# Patient Record
Sex: Male | Born: 1990 | Race: White | Hispanic: No | Marital: Single | Smoking: Never smoker
Health system: Southern US, Community
[De-identification: ages and names within clinical notes are randomized; demographics above are authoritative.]

## PROBLEM LIST (undated history)

## (undated) DIAGNOSIS — T7840XA Allergy, unspecified, initial encounter: Secondary | ICD-10-CM

## (undated) HISTORY — DX: Allergy, unspecified, initial encounter: T78.40XA

---
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
## 2011-01-02 ENCOUNTER — Encounter: Payer: Self-pay | Admitting: Family Medicine

## 2011-09-07 LAB — COMPREHENSIVE METABOLIC PANEL
ALT: 9
Albumin: 3.8
Alkaline Phosphatase: 123
BUN: 12
Chloride: 103
Glucose, Bld: 104 — ABNORMAL HIGH
Potassium: 4.6
Sodium: 137
Total Bilirubin: 1.3 — ABNORMAL HIGH

## 2011-09-07 LAB — CBC
HCT: 40.1
Hemoglobin: 13.6
WBC: 5.9

## 2011-09-07 LAB — DIFFERENTIAL
Basophils Absolute: 0
Basophils Relative: 1
Eosinophils Absolute: 0.1
Monocytes Absolute: 0.4
Neutro Abs: 3.8

## 2011-09-07 LAB — URINALYSIS, ROUTINE W REFLEX MICROSCOPIC
Bilirubin Urine: NEGATIVE
Hgb urine dipstick: NEGATIVE
Ketones, ur: NEGATIVE
Protein, ur: NEGATIVE
Urobilinogen, UA: 0.2

## 2011-09-07 LAB — URINE CULTURE: Culture: NO GROWTH

## 2016-11-29 ENCOUNTER — Telehealth (INDEPENDENT_AMBULATORY_CARE_PROVIDER_SITE_OTHER): Payer: Self-pay

## 2016-11-29 NOTE — Telephone Encounter (Signed)
Please advise 

## 2016-11-29 NOTE — Telephone Encounter (Signed)
Patient mother calling stating patient fell, had a snowboarding accident. They told him he had wide AC Joint and to see ortho doc, she is wondering how long he can go like this since [place] in [place]. He is wearing a sling at the moment. She would like a call back.  [phone]

## 2016-11-29 NOTE — Telephone Encounter (Signed)
Should come in by end of next week

## 2016-11-30 NOTE — Telephone Encounter (Signed)
Called Marcelino DusterMichelle pt's mother she states is it really necessary for him to come in?  He has a tight schedule and will be here on christmas and will go back to [place] and will be able to come in next year (JAN). Please advise.

## 2016-11-30 NOTE — Telephone Encounter (Signed)
It's not ideal but if it has to be it'll have to do

## 2016-11-30 NOTE — Telephone Encounter (Signed)
So what should I tell mom?

## 2016-11-30 NOTE — Telephone Encounter (Signed)
ok 

## 2016-12-01 NOTE — Telephone Encounter (Signed)
Per Dr Roda ShuttersXu okay to come in until Jan.

## 2016-12-02 NOTE — Telephone Encounter (Signed)
Called pt to advise on message 

## 2016-12-15 ENCOUNTER — Encounter (INDEPENDENT_AMBULATORY_CARE_PROVIDER_SITE_OTHER): Payer: Self-pay

## 2016-12-15 ENCOUNTER — Encounter (INDEPENDENT_AMBULATORY_CARE_PROVIDER_SITE_OTHER): Payer: Self-pay | Admitting: Orthopaedic Surgery

## 2016-12-15 ENCOUNTER — Ambulatory Visit (INDEPENDENT_AMBULATORY_CARE_PROVIDER_SITE_OTHER): Payer: Self-pay

## 2016-12-15 ENCOUNTER — Ambulatory Visit (INDEPENDENT_AMBULATORY_CARE_PROVIDER_SITE_OTHER): Payer: BLUE CROSS/BLUE SHIELD | Admitting: Orthopaedic Surgery

## 2016-12-15 DIAGNOSIS — M25512 Pain in left shoulder: Secondary | ICD-10-CM | POA: Insufficient documentation

## 2016-12-15 DIAGNOSIS — S43102A Unspecified dislocation of left acromioclavicular joint, initial encounter: Secondary | ICD-10-CM | POA: Diagnosis not present

## 2016-12-15 NOTE — Progress Notes (Signed)
   Office Visit Note   Patient: Austin Hurst           Date of Birth: 05/30/1991           MRN: [id number] Visit Date: 12/15/2016              Requested by: No referring provider defined for this encounter. PCP: No PCP Per Patient   Assessment & Plan: Visit Diagnoses:  1. Acute pain of left shoulder     Plan: Impression is grade 2 acromioclavicular separation. X-rays were reviewed with the patient. I recommend no heavy lifting for 4 more weeks. I gave him home exercises to do. I think he'll recover from this just fine. Questions encouraged and answered today. Follow-up as needed.  Follow-Up Instructions: Return if symptoms worsen or fail to improve.   Orders:  Orders Placed This Encounter  Procedures  . XR Shoulder Left   No orders of the defined types were placed in this encounter.     Procedures: No procedures performed   Clinical Data: No additional findings.   Subjective: Chief Complaint  Patient presents with  . Left Shoulder - Pain, New Patient (Initial Visit)    Patient is a healthy 26 year old gentleman who sustained a left shoulder injury while snowboarding about 2 weeks ago. He landed directly onto his left shoulder had immediate pain. The pain does not radiate. He does have worsening pain with elevation of the arm. This is getting better overall. [facility] He does feel a prominent distal clavicle today. He has some trouble reaching back and out. Pain does not radiate.    Review of Systems Complete review of systems is negative except for history of present illness  Objective: Vital Signs: There were no vitals taken for this visit.  Physical Exam Well-developed nourished acute distress alert 3 nonlabored breathing normal judgments affect abdomen soft no lymphadenopathy Ortho Exam Exam of the left shoulder shows tender acromioclavicular joint with positive cross adduction sign. Mildly positive impingement  sign. Rotator cuff is strong and intact. Range of motion is only slightly limited secondary to discomfort. Specialty Comments:  No specialty comments available.  Imaging: Xr Shoulder Left  Result Date: 12/15/2016 Grade 2 AC separation    PMFS History: Patient Active Problem List   Diagnosis Date Noted  . Acute pain of left shoulder 12/15/2016   No past medical history on file.  No family history on file.  No past surgical history on file. Social History   Occupational History  . Not on file.   Social History Main Topics  . Smoking status: Never Smoker  . Smokeless tobacco: Never Used  . Alcohol use Not on file  . Drug use: Unknown  . Sexual activity: Not on file

## 2019-09-17 ENCOUNTER — Other Ambulatory Visit: Payer: Self-pay

## 2019-09-17 DIAGNOSIS — Z20822 Contact with and (suspected) exposure to covid-19: Secondary | ICD-10-CM

## 2019-09-19 LAB — NOVEL CORONAVIRUS, NAA: SARS-CoV-2, NAA: NOT DETECTED

## 2020-04-21 ENCOUNTER — Ambulatory Visit: Payer: Self-pay | Attending: Internal Medicine

## 2020-04-21 DIAGNOSIS — Z23 Encounter for immunization: Secondary | ICD-10-CM

## 2020-04-21 NOTE — Progress Notes (Signed)
   Covid-19 Vaccination Clinic  Name:  Austin Hurst    MRN: [id number] DOB: 08-26-1991  04/21/2020  Austin Hurst was observed post Covid-19 immunization for 15 minutes without incident. He was provided with Vaccine Information Sheet and instruction to access the V-Safe system.   Austin Hurst was instructed to call 911 with any severe reactions post vaccine: Marland Kitchen Difficulty breathing  . Swelling of face and throat  . A fast heartbeat  . A bad rash all over body  . Dizziness and weakness   Immunizations Administered    Name Date Dose VIS Date Route   Pfizer COVID-19 Vaccine 04/21/2020  2:40 PM 0.3 mL 02/05/2019 Intramuscular   Manufacturer: [employer], [employer]   Lot: [id number]   NDC: [address]-1

## 2020-05-12 ENCOUNTER — Ambulatory Visit: Payer: Self-pay | Attending: Internal Medicine

## 2020-05-12 DIAGNOSIS — Z23 Encounter for immunization: Secondary | ICD-10-CM

## 2020-05-12 NOTE — Progress Notes (Signed)
   Covid-19 Vaccination Clinic  Name:  Austin Hurst    MRN: [id number] DOB: 03-30-1991  05/12/2020  Mr. Austin Hurst was observed post Covid-19 immunization for 15 minutes without incident. He was provided with Vaccine Information Sheet and instruction to access the V-Safe system.   Mr. Austin Hurst was instructed to call 911 with any severe reactions post vaccine: Marland Kitchen Difficulty breathing  . Swelling of face and throat  . A fast heartbeat  . A bad rash all over body  . Dizziness and weakness   Immunizations Administered    Name Date Dose VIS Date Route   Pfizer COVID-19 Vaccine 05/12/2020  2:39 PM 0.3 mL 02/05/2019 Intramuscular   Manufacturer: [employer], [employer]   Lot: [id number]   NDC: [address]-1

## 2022-04-05 ENCOUNTER — Ambulatory Visit (INDEPENDENT_AMBULATORY_CARE_PROVIDER_SITE_OTHER): Payer: BC Managed Care – PPO

## 2022-04-05 ENCOUNTER — Ambulatory Visit: Payer: BC Managed Care – PPO | Admitting: Orthopaedic Surgery

## 2022-04-05 ENCOUNTER — Encounter: Payer: Self-pay | Admitting: Orthopaedic Surgery

## 2022-04-05 DIAGNOSIS — M25571 Pain in right ankle and joints of right foot: Secondary | ICD-10-CM

## 2022-04-05 NOTE — Progress Notes (Signed)
? ?  Office Visit Note ?  ?Patient: Austin Hurst           ?Date of Birth: April 03, 1991           ?MRN: [id number] ?Visit Date: 04/05/2022 ?             ?Requested by: No referring provider defined for this encounter. ?PCP: Patient, No Pcp Per (Inactive) ? ? ?Assessment & Plan: ?Visit Diagnoses:  ?1. Pain in right ankle and joints of right foot   ? ? ?Plan: Impression is right grade 2 ankle sprain.  At this point, would like to immobilize the patient in a cam boot weightbearing as tolerated.  He will continue to RICE.  He may transition into an ASO brace over the next week or 2 as symptoms allow.  Follow-up with [place] in 3 weeks time if his symptoms have not improved.  Call with concerns or questions in the meantime. ? ?Follow-Up Instructions: Return in about 3 weeks (around 04/26/2022).  ? ?Orders:  ?Orders Placed This Encounter  ?Procedures  ? XR Ankle Complete Right  ? ?No orders of the defined types were placed in this encounter. ? ? ? ? Procedures: ?No procedures performed ? ? ?Clinical Data: ?No additional findings. ? ? ?Subjective: ?Chief Complaint  ?Patient presents with  ? Right Ankle - Pain  ? ? ?HPI patient is a pleasant 31 year old gentleman who comes in today following an injury to his right ankle.  He was playing kickball last Wednesday when he inverted the right ankle while jogging.  He initially had significant pain and swelling which has improved quite a bit over the past week.  His pain is primarily to the anterolateral ankle.  Pain is worse first thing in the morning when walking but does improve throughout the day.  He has been using Epsom salt Advil and ice as well as elevating the right lower extremity.  He does note that he has a history of rolling that ankle multiple times in the past. ? ?Review of Systems as detailed in HPI.  All others reviewed and are negative. ? ? ?Objective: ?Vital Signs: There were no vitals taken for this visit. ? ?Physical Exam well-developed well-nourished gentleman in no  acute distress.  Alert and oriented x3. ? ?Ortho Exam right ankle exam reveals mild ecchymosis.  Mild swelling.  He does have tenderness to the ATFL.  No tenderness to the distal fibula or medial malleolus.  He has increased pain with plantarflexion and inversion.  He is neurovascular intact distally. ? ?Specialty Comments:  ?No specialty comments available. ? ?Imaging: ?No results found. ? ? ?PMFS History: ?Patient Active Problem List  ? Diagnosis Date Noted  ? Acute pain of left shoulder 12/15/2016  ? ?History reviewed. No pertinent past medical history.  ?History reviewed. No pertinent family history.  ?History reviewed. No pertinent surgical history. ?Social History  ? ?Occupational History  ? Not on file  ?Tobacco Use  ? Smoking status: Never  ? Smokeless tobacco: Never  ?Substance and Sexual Activity  ? Alcohol use: Not on file  ? Drug use: Not on file  ? Sexual activity: Not on file  ? ? ? ? ? ? ?

## 2022-04-26 ENCOUNTER — Ambulatory Visit: Payer: BC Managed Care – PPO | Admitting: Orthopaedic Surgery

## 2022-09-12 ENCOUNTER — Encounter: Payer: Self-pay | Admitting: Family Medicine

## 2022-09-12 ENCOUNTER — Ambulatory Visit: Payer: BC Managed Care – PPO | Admitting: Family Medicine

## 2022-09-12 VITALS — BP 110/72 | HR 64 | Temp 98.2°F | Ht 70.0 in | Wt 226.4 lb

## 2022-09-12 DIAGNOSIS — Z23 Encounter for immunization: Secondary | ICD-10-CM

## 2022-09-12 DIAGNOSIS — Z0000 Encounter for general adult medical examination without abnormal findings: Secondary | ICD-10-CM

## 2022-09-12 LAB — CBC
HCT: 44 % (ref 39.0–52.0)
Hemoglobin: 14.8 g/dL (ref 13.0–17.0)
MCHC: 33.7 g/dL (ref 30.0–36.0)
MCV: 86.9 fl (ref 78.0–100.0)
Platelets: 213 10*3/uL (ref 150.0–400.0)
RBC: 5.06 Mil/uL (ref 4.22–5.81)
RDW: 13.8 % (ref 11.5–15.5)
WBC: 5.7 10*3/uL (ref 4.0–10.5)

## 2022-09-12 LAB — COMPREHENSIVE METABOLIC PANEL
ALT: 22 U/L (ref 0–53)
AST: 18 U/L (ref 0–37)
Albumin: 4.5 g/dL (ref 3.5–5.2)
Alkaline Phosphatase: 68 U/L (ref 39–117)
BUN: 13 mg/dL (ref 6–23)
CO2: 28 mEq/L (ref 19–32)
Calcium: 9.9 mg/dL (ref 8.4–10.5)
Chloride: 102 mEq/L (ref 96–112)
Creatinine, Ser: 1.02 mg/dL (ref 0.40–1.50)
GFR: 98.99 mL/min (ref >60.00)
Glucose, Bld: 93 mg/dL (ref 70–99)
Potassium: 4.9 mEq/L (ref 3.5–5.1)
Sodium: 138 mEq/L (ref 135–145)
Total Bilirubin: 0.4 mg/dL (ref 0.2–1.2)
Total Protein: 6.8 g/dL (ref 6.0–8.3)

## 2022-09-12 LAB — LIPID PANEL
Cholesterol: 225 mg/dL — ABNORMAL HIGH (ref 0–200)
HDL: 44.5 mg/dL (ref >39.00)
LDL Cholesterol: 164 mg/dL — ABNORMAL HIGH (ref 0–99)
NonHDL: 180.85
Total CHOL/HDL Ratio: 5
Triglycerides: 84 mg/dL (ref 0.0–149.0)
VLDL: 16.8 mg/dL (ref 0.0–40.0)

## 2022-09-12 LAB — TSH: TSH: 1.73 u[IU]/mL (ref 0.35–5.50)

## 2022-09-12 NOTE — Progress Notes (Signed)
Chief Complaint  Patient presents with   New Patient (Initial Visit)    Well Male Austin Hurst is here for a complete physical.   His last physical was >1 year ago.  Current diet: in general, a "healthy" diet.   Current exercise: lifting wts, some cardio Weight trend: stable; getting more difficult to lose weight Fatigue out of ordinary? No. Seat belt? Yes.   Advanced directive? No  Health maintenance Tetanus- Yes HIV- Yes Hep C- Yes  Past Medical History:  Diagnosis Date   Allergy      History reviewed. No pertinent surgical history.  Medications  Current Outpatient Medications on File Prior to Visit  Medication Sig Dispense Refill   Ascorbic Acid (VITAMIN C) 1000 MG tablet Take 1,000 mg by mouth daily.     zinc gluconate 50 MG tablet Take 50 mg by mouth daily as needed.     Allergies No Known Allergies  Family History Family History  Problem Relation Age of Onset   Hypertension Mother    Hyperlipidemia Mother    Alcohol abuse Father    Drug abuse Father    Hyperlipidemia Maternal Grandmother    Arthritis Maternal Grandmother     Review of Systems: Constitutional: no fevers or chills Eye:  no recent significant change in vision Ear/Nose/Mouth/Throat:  Ears:  no hearing loss Nose/Mouth/Throat:  no complaints of nasal congestion, no sore throat Cardiovascular:  no chest pain Respiratory:  no shortness of breath Gastrointestinal:  no abdominal pain, no change in bowel habits GU:  Male: negative for dysuria Musculoskeletal/Extremities:  no pain of the joints Integumentary (Skin/Breast):  no abnormal skin lesions reported Neurologic:  no headaches Endocrine: No unexpected weight changes Hematologic/Lymphatic:  no night sweats  Exam BP 110/72 (BP Location: Left Arm, Patient Position: Sitting, Cuff Size: Large)   Pulse 64   Temp 98.2 F (36.8 C) (Oral)   Ht 5\' 10"  (1.778 m)   Wt 226 lb 6 oz (102.7 kg)   SpO2 98%   BMI 32.48 kg/m  General:  well  developed, well nourished, in no apparent distress Skin:  no significant moles, warts, or growths Head:  no masses, lesions, or tenderness Eyes:  pupils equal and round, sclera anicteric without injection Ears:  canals without lesions, TMs shiny without retraction, no obvious effusion, no erythema Nose:  nares patent, mucosa normal Throat/Pharynx:  lips and gingiva without lesion; tongue and uvula midline; non-inflamed pharynx; no exudates or postnasal drainage Neck: neck supple without adenopathy, thyromegaly, or masses Lungs:  clear to auscultation, breath sounds equal bilaterally, no respiratory distress Cardio:  regular rate and rhythm, no bruits, no LE edema Abdomen:  abdomen soft, nontender; bowel sounds normal; no masses or organomegaly Genital (male): Deferred Rectal: Deferred Musculoskeletal:  symmetrical muscle groups noted without atrophy or deformity Extremities:  no clubbing, cyanosis, or edema, no deformities, no skin discoloration Neuro:  gait normal; deep tendon reflexes normal and symmetric Psych: well oriented with normal range of affect and appropriate judgment/insight  Assessment and Plan  Well adult exam - Plan: TSH, Lipid panel, CBC, Comprehensive metabolic panel   Well 31 y.o. male. Counseled on diet and exercise. Flu shot today. Discussed wt loss. Increase cardio, optimize time sleeping, vary rep schemes.  Advanced directive form provided today.  Self testicular exams recommended at least monthly.  Other orders as above. Follow up in 1 year pending the above workup. The patient voiced understanding and agreement to the plan.  [place], DO 09/12/22 9:04 AM

## 2022-09-12 NOTE — Patient Instructions (Addendum)
Give [place] 2-3 business days to get the results of your labs back.   Keep the diet clean and stay active.  Try to get 7-9 hrs of sleep nightly.   Add some longer endurance like cycling, doing the elliptical, or running.   Do monthly self testicular checks in the shower. You are feeling for lumps/bumps that don't belong. If you feel anything like this, let me know!  Let us know if you need anything.

## 2022-09-12 NOTE — Addendum Note (Signed)
Addended by: Jeronimo Greaves on: 09/12/2022 09:14 AM   Modules accepted: Orders

## 2022-09-13 ENCOUNTER — Other Ambulatory Visit: Payer: Self-pay | Admitting: Family Medicine

## 2022-09-13 DIAGNOSIS — E785 Hyperlipidemia, unspecified: Secondary | ICD-10-CM

## 2022-10-25 ENCOUNTER — Other Ambulatory Visit (INDEPENDENT_AMBULATORY_CARE_PROVIDER_SITE_OTHER): Payer: BC Managed Care – PPO

## 2022-10-25 DIAGNOSIS — E785 Hyperlipidemia, unspecified: Secondary | ICD-10-CM

## 2022-10-25 LAB — LIPID PANEL
Cholesterol: 269 mg/dL — ABNORMAL HIGH (ref 0–200)
HDL: 45.5 mg/dL (ref >39.00)
LDL Cholesterol: 203 mg/dL — ABNORMAL HIGH (ref 0–99)
NonHDL: 223.92
Total CHOL/HDL Ratio: 6
Triglycerides: 103 mg/dL (ref 0.0–149.0)
VLDL: 20.6 mg/dL (ref 0.0–40.0)

## 2022-11-08 ENCOUNTER — Telehealth: Payer: Self-pay | Admitting: Family Medicine

## 2022-11-08 NOTE — Telephone Encounter (Signed)
Patient called stating he got a bill from his insurance stating the Thyroid labs from 10/02 were not going to be covered. After speaking with his insurance they advised him that the predecessor coding for the thyroid lab was wrong and it just needed to be corrected. Please advise.

## 2022-11-11 NOTE — Telephone Encounter (Signed)
Per coding:  The TSH lab was billed correctly according to documentation in chart, so I am not able to change.   Per Dr Hollie Beach note, he ordered the test for preventive dx, Z00.00, and this is also the dx that was associated with TSH test on the lab order.    Message sent to pt via mychart

## 2023-09-15 ENCOUNTER — Encounter: Payer: BC Managed Care – PPO | Admitting: Family Medicine

## 2023-10-20 ENCOUNTER — Encounter: Payer: Self-pay | Admitting: Family Medicine

## 2023-10-20 ENCOUNTER — Ambulatory Visit (INDEPENDENT_AMBULATORY_CARE_PROVIDER_SITE_OTHER): Payer: BC Managed Care – PPO | Admitting: Family Medicine

## 2023-10-20 VITALS — BP 122/78 | HR 66 | Temp 98.0°F | Resp 16 | Ht 70.0 in | Wt 225.0 lb

## 2023-10-20 DIAGNOSIS — Z23 Encounter for immunization: Secondary | ICD-10-CM | POA: Diagnosis not present

## 2023-10-20 DIAGNOSIS — Z0000 Encounter for general adult medical examination without abnormal findings: Secondary | ICD-10-CM

## 2023-10-20 DIAGNOSIS — Z13220 Encounter for screening for lipoid disorders: Secondary | ICD-10-CM | POA: Diagnosis not present

## 2023-10-20 LAB — LIPID PANEL
Cholesterol: 280 mg/dL — ABNORMAL HIGH (ref 0–200)
HDL: 48.7 mg/dL (ref >39.00)
LDL Cholesterol: 211 mg/dL — ABNORMAL HIGH (ref 0–99)
NonHDL: 231.69
Total CHOL/HDL Ratio: 6
Triglycerides: 105 mg/dL (ref 0.0–149.0)
VLDL: 21 mg/dL (ref 0.0–40.0)

## 2023-10-20 LAB — COMPREHENSIVE METABOLIC PANEL
ALT: 37 U/L (ref 0–53)
AST: 24 U/L (ref 0–37)
Albumin: 4.5 g/dL (ref 3.5–5.2)
Alkaline Phosphatase: 67 U/L (ref 39–117)
BUN: 16 mg/dL (ref 6–23)
CO2: 29 meq/L (ref 19–32)
Calcium: 9.6 mg/dL (ref 8.4–10.5)
Chloride: 103 meq/L (ref 96–112)
Creatinine, Ser: 0.96 mg/dL (ref 0.40–1.50)
GFR: 105.64 mL/min (ref >60.00)
Glucose, Bld: 96 mg/dL (ref 70–99)
Potassium: 4.9 meq/L (ref 3.5–5.1)
Sodium: 138 meq/L (ref 135–145)
Total Bilirubin: 0.5 mg/dL (ref 0.2–1.2)
Total Protein: 7 g/dL (ref 6.0–8.3)

## 2023-10-20 LAB — CBC
HCT: 45.8 % (ref 39.0–52.0)
Hemoglobin: 15.2 g/dL (ref 13.0–17.0)
MCHC: 33.2 g/dL (ref 30.0–36.0)
MCV: 88.3 fL (ref 78.0–100.0)
Platelets: 243 10*3/uL (ref 150.0–400.0)
RBC: 5.18 Mil/uL (ref 4.22–5.81)
RDW: 13.8 % (ref 11.5–15.5)
WBC: 5.5 10*3/uL (ref 4.0–10.5)

## 2023-10-20 NOTE — Progress Notes (Signed)
Chief Complaint  Patient presents with   Annual Exam    Annual Exam    Well Male Austin Hurst is here for a complete physical.   His last physical was >1 year ago.  Current diet: in general, a "healthy" diet.   Current exercise: stair master, lifting wts, cardio, basketball Weight trend: stable Fatigue out of ordinary? No. Seat belt? Yes.   Advanced directive? No  Health maintenance Tetanus- Unsure HIV- Yes Hep C- Yes  Past Medical History:  Diagnosis Date   Allergy      Past Surgical History:  Procedure Laterality Date   OTHER SURGICAL HISTORY  2010   procedure for low back    Medications  Current Outpatient Medications on File Prior to Visit  Medication Sig Dispense Refill   Ascorbic Acid (VITAMIN C) 1000 MG tablet Take 1,000 mg by mouth daily.     zinc gluconate 50 MG tablet Take 50 mg by mouth daily as needed.      Allergies No Known Allergies  Family History Family History  Problem Relation Age of Onset   Hypertension Mother    Hyperlipidemia Mother    Alcohol abuse Father    Drug abuse Father    Hyperlipidemia Maternal Grandmother    Arthritis Maternal Grandmother     Review of Systems: Constitutional: no fevers or chills Eye:  no recent significant change in vision Ear/Nose/Mouth/Throat:  Ears:  no hearing loss Nose/Mouth/Throat:  no complaints of nasal congestion, no sore throat Cardiovascular:  no chest pain Respiratory:  no shortness of breath Gastrointestinal:  no abdominal pain, no change in bowel habits GU:  Male: negative for dysuria Musculoskeletal/Extremities: +L quad pain Integumentary (Skin/Breast):  no abnormal skin lesions reported Neurologic:  no headaches Endocrine: No unexpected weight changes Hematologic/Lymphatic:  no night sweats  Exam BP 122/78 (BP Location: Left Arm, Patient Position: Sitting, Cuff Size: Normal)   Pulse 66   Temp 98 F (36.7 C) (Oral)   Resp 16   Ht 5\' 10"  (1.778 m)   Wt 225 lb (102.1 kg)   SpO2  96%   BMI 32.28 kg/m  General:  well developed, well nourished, in no apparent distress Skin:  no significant moles, warts, or growths Head:  no masses, lesions, or tenderness Eyes:  pupils equal and round, sclera anicteric without injection Ears:  canals without lesions, TMs shiny without retraction, no obvious effusion, no erythema Nose:  nares patent, mucosa normal Throat/Pharynx:  lips and gingiva without lesion; tongue and uvula midline; non-inflamed pharynx; no exudates or postnasal drainage Neck: neck supple without adenopathy, thyromegaly, or masses Lungs:  clear to auscultation, breath sounds equal bilaterally, no respiratory distress Cardio:  regular rate and rhythm, no bruits, no LE edema Abdomen:  abdomen soft, nontender; bowel sounds normal; no masses or organomegaly Genital (male): Deferred Rectal: Deferred Musculoskeletal: +TTP over VL on L thigh; symmetrical muscle groups noted without atrophy or deformity Extremities:  no clubbing, cyanosis, or edema, no deformities, no skin discoloration Neuro:  gait normal; deep tendon reflexes normal and symmetric Psych: well oriented with normal range of affect and appropriate judgment/insight  Assessment and Plan  Well adult exam - Plan: CBC, Comprehensive metabolic panel, Lipid panel  Need for Tdap vaccination - Plan: Tdap vaccine greater than or equal to 7yo IM   Well 32 y.o. male. Counseled on diet and exercise. Self testicular exams recommended at least monthly.  Quad stretches/exercises provided.  Advanced directive form provided today.  Tdap today.  Counseling info provided.  May need to start statin.  Other orders as above. Follow up in 1 year pending the above workup. The patient voiced understanding and agreement to the plan.  Jilda Roche [place], DO 10/20/23 9:05 AM

## 2023-10-20 NOTE — Patient Instructions (Addendum)
Give [place] 2-3 business days to get the results of your labs back.   Keep the diet clean and stay active.  Please get me a copy of your advanced directive form at your convenience.   Do monthly self testicular checks in the shower. You are feeling for lumps/bumps that don't belong. If you feel anything like this, let me know!  Please consider counseling. Contact [phone] to schedule an appointment or inquire about cost/insurance coverage.  [facility] located at [address], [place], [place].  Phone number = [phone].  Dr. Regan Lemming - Adult Psychiatry.    [facility] located at [address] [place], [place], [place]. Phone number = [phone].   [facility] located at [address], [place], [place].  Phone number = [phone].   [facility] located at [address] [place], [place], [place].  Phone number = [phone].  Let us know if you need anything.  Quadriceps Strain Rehab It is normal to feel mild stretching, pulling, tightness, or discomfort as you do these exercises, but you should stop right away if you feel sudden pain or your pain gets worse. Stretching and range of motion exercises These exercises warm up your muscles and joints and improve the movement and flexibility of your thigh. These exercises can also help to relieve stiffness or swelling. Exercise A: Heel slides    Lie on your back with both knees straight. If this causes back discomfort, bend the knee of your healthy leg, placing your foot flat on the floor. Slowly slide your left / right heel back toward your buttocks until you feel a gentle stretch in the front of your knee or thigh. Hold for 30 seconds. Then slowly slide your heel back to the starting position. Repeat 2 times. Complete this exercise 3 times a week. Exercise B: Quadriceps stretch, prone    Lie on your abdomen on a firm surface, such as a bed or padded  floor. Bend your left / right knee and hold your ankle. If you cannot reach your ankle or pant leg, loop a belt around your foot and grab the belt instead. Gently pull your heel toward your buttocks. Your knee should not slide out to the side. You should feel a stretch in the front of your thigh and knee. Hold this position for 30 seconds. Repeat 2 times. Complete this exercise 3 times a week. Strengthening exercises These exercises build strength and endurance in your thigh. Endurance is the ability to use your muscles for a long time, even after your muscles get tired. Exercise C: Straight leg raises (quadriceps and hip flexors) Quality counts! Watch for signs that the quadriceps muscle is working to ensure that you are strengthening the correct muscles and not cheating by using healthier muscles. Lie on your back with your left / right leg extended and your other knee bent. Tense the muscles in the front of your left / right thigh. You should see your kneecap slide up or see increased dimpling just above the knee. Tighten these muscles even more and raise your leg 4-6 inches (10-15 cm) off the floor. Hold for 3 seconds. Keep the thigh muscles tense as you lower your leg. Relax the muscles slowly and completely after each repetition. Repeat 2 times. Complete this exercise 3 times a week. Exercise D: Straight leg raises (hip extensors) Lie on your belly on a bed or a firm surface with a pillow under your hips. Bend your left /  right knee so your foot is straight up in the air. Tense your buttock muscles and lift your left / right thigh off the bed. Do not let your back arch. Hold this position for 3 seconds. Slowly return to the starting position. Let your muscles relax completely before doing another repetition. Repeat 2 times. Complete this exercise 3 times a week. Exercise E: Wall sits    Follow the directions for form closely. If you do not place your feet and knees properly, this can  lead to knee pain. Lean back against a smooth wall or door and walk your feet out 18-24 inches (46-61 cm) from it. Place your feet hip-width apart. Slowly slide down the wall or door until your knees bend  60-90 degrees. Keep your weight back and over your heels, not over your toes. Keep your thighs straight or pointing slightly outward. Hold for 1 second. Use your thigh and buttock muscles to push you back up to a standing position. Keep your weight through your heels while you do this. Rest for 5 seconds in between repetitions. Repeat 2 times. Complete this exercise 3 times a week. Make sure you discuss any questions you have with your health care provider. Document Released: 11/28/2005 Document Revised: 08/04/2016 Document Reviewed: 09/01/2015 Elsevier Interactive Patient Education  [employer].

## 2024-10-21 ENCOUNTER — Encounter: Payer: BC Managed Care – PPO | Admitting: Family Medicine
